# Patient Record
Sex: Female | Born: 1985 | Race: White | Hispanic: No | Marital: Married | Smoking: Former smoker
Health system: Southern US, Community
[De-identification: ages and names within clinical notes are randomized; demographics above are authoritative.]

## PROBLEM LIST (undated history)

## (undated) DIAGNOSIS — I10 Essential (primary) hypertension: Secondary | ICD-10-CM

---
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
## 2005-01-07 ENCOUNTER — Emergency Department: Payer: Self-pay | Admitting: Emergency Medicine

## 2007-11-23 ENCOUNTER — Emergency Department: Payer: Self-pay | Admitting: Emergency Medicine

## 2007-12-06 ENCOUNTER — Observation Stay: Payer: Self-pay | Admitting: Obstetrics and Gynecology

## 2007-12-07 ENCOUNTER — Observation Stay: Payer: Self-pay

## 2007-12-08 ENCOUNTER — Inpatient Hospital Stay: Payer: Self-pay | Admitting: Obstetrics and Gynecology

## 2009-07-24 ENCOUNTER — Emergency Department: Payer: Self-pay | Admitting: Emergency Medicine

## 2010-06-22 ENCOUNTER — Emergency Department: Payer: Self-pay | Admitting: Unknown Physician Specialty

## 2013-07-11 ENCOUNTER — Encounter: Payer: Self-pay | Admitting: *Deleted

## 2013-07-11 ENCOUNTER — Ambulatory Visit: Payer: Self-pay | Admitting: Cardiovascular Disease

## 2014-02-10 ENCOUNTER — Emergency Department: Payer: Self-pay | Admitting: Emergency Medicine

## 2014-10-21 ENCOUNTER — Emergency Department: Payer: Self-pay | Admitting: Internal Medicine

## 2014-10-29 ENCOUNTER — Observation Stay: Payer: Self-pay | Admitting: Surgery

## 2015-01-11 NOTE — Op Note (Signed)
PATIENT NAME:  Kristen Jacobson, Kristen Jacobson MR#:  [id number] DATE OF BIRTH:  08/16/1986  DATE OF PROCEDURE:  10/29/2014  PREOPERATIVE DIAGNOSIS: Right breast abscess.   POSTOPERATIVE DIAGNOSIS: Right breast abscess.  OPERATION: Incision and drainage.   ANESTHESIA: General.   SURGEON: Carmie Endalph L. Ely III, Jacobson.D.   OPERATIVE PROCEDURE: With the patient in the supine position after the induction of appropriate general anesthesia, patient's right breast was prepped with ChloraPrep and draped with sterile towels. Incision was made in the obvious fluctuant spot in the medial portion of the right breast. Creamy yellow pus was produced immediately. It was cultured. There appeared to be a fairly good-sized cavity which was opened.  A counter incision was made in the upper outer quadrant. A Penrose drain was placed between the two incisions. The drain was secured in place with 3-0 nylon.  Fluffs and a compressive dressing were applied. The patient was returned to the recovery room having tolerated the procedure well. Sponge, instrument, and needle count were correct x 2 in the operating room.   ____________________________ Quentin Orealph L. Ely III, MD rle:am D: 10/29/2014 17:51:23 ET T: 10/30/2014 02:46:36 ET JOB#: [id number]  cc: Quentin Orealph L. Ely III, MD, <Dictator> Quentin OreALPH L ELY MD ELECTRONICALLY SIGNED 10/31/2014 18:02

## 2016-05-30 ENCOUNTER — Encounter: Payer: Self-pay | Admitting: Emergency Medicine

## 2016-05-30 ENCOUNTER — Emergency Department: Payer: Self-pay

## 2016-05-30 ENCOUNTER — Emergency Department
Admission: EM | Admit: 2016-05-30 | Discharge: 2016-05-30 | Disposition: A | Discharge status: Home / Self Care | Payer: Self-pay | Attending: Emergency Medicine | Admitting: Emergency Medicine

## 2016-05-30 DIAGNOSIS — W19XXXA Unspecified fall, initial encounter: Secondary | ICD-10-CM | POA: Insufficient documentation

## 2016-05-30 DIAGNOSIS — S300XXA Contusion of lower back and pelvis, initial encounter: Secondary | ICD-10-CM | POA: Insufficient documentation

## 2016-05-30 DIAGNOSIS — Y9389 Activity, other specified: Secondary | ICD-10-CM | POA: Insufficient documentation

## 2016-05-30 DIAGNOSIS — Y929 Unspecified place or not applicable: Secondary | ICD-10-CM | POA: Insufficient documentation

## 2016-05-30 DIAGNOSIS — F17200 Nicotine dependence, unspecified, uncomplicated: Secondary | ICD-10-CM | POA: Insufficient documentation

## 2016-05-30 DIAGNOSIS — Y999 Unspecified external cause status: Secondary | ICD-10-CM | POA: Insufficient documentation

## 2016-05-30 LAB — POCT PREGNANCY, URINE: PREG TEST UR: NEGATIVE

## 2016-05-30 MED ORDER — NAPROXEN 500 MG PO TABS: 500.0000 mg | ORAL_TABLET | Freq: Two times a day (BID) | ORAL | 0 refills | Status: DC

## 2016-05-30 MED ORDER — OXYCODONE-ACETAMINOPHEN 5-325 MG PO TABS
2.0000 | ORAL_TABLET | Freq: Once | ORAL | Status: AC
  Administered 2016-05-30: 2 via ORAL
  Filled 2016-05-30: qty 2

## 2016-05-30 MED ORDER — OXYCODONE-ACETAMINOPHEN 5-325 MG PO TABS: 1.0000 | ORAL_TABLET | ORAL | 0 refills | Status: DC | PRN

## 2016-05-30 NOTE — ED Provider Notes (Signed)
[facility] Emergency Department Provider Note  ____________________________________________  Time seen: Approximately 1:42 PM  I have reviewed the triage vital signs and the nursing notes.   HISTORY  Chief Complaint Fall    HPI Kristen Jacobson is a 30 y.o. female who presents for evaluation of tailbone pain. Patient reports that she fell landing on her buttocks in the shower this morning. Describes pain as 10 over 10 nonradiating. Patient states she is unable to sit or stand comfortably.   History reviewed. No pertinent past medical history.  There are no active problems to display for this patient.   History reviewed. No pertinent surgical history.  Prior to Admission medications   Medication Sig Start Date End Date Taking? Authorizing Provider  naproxen (NAPROSYN) 500 MG tablet Take 1 tablet (500 mg total) by mouth 2 (two) times daily with a meal. 05/30/16   Evangeline Dakin, PA-C  oxyCODONE-acetaminophen (ROXICET) 5-325 MG tablet Take 1-2 tablets by mouth every 4 (four) hours as needed for severe pain. 05/30/16   Evangeline Dakin, PA-C    Allergies Review of patient's allergies indicates no known allergies.  No family history on file.  Social History Social History  Substance Use Topics  . Smoking status: Current Every Day Smoker  . Smokeless tobacco: Never Used  . Alcohol use No    Review of Systems Constitutional: No fever/chills Cardiovascular: Denies chest pain. Respiratory: Denies shortness of breath. Musculoskeletal: Positive for tailbone pain. Skin: Negative for rash. Neurological: Negative for headaches, focal weakness or numbness.  10-point ROS otherwise negative.  ____________________________________________   PHYSICAL EXAM:  VITAL SIGNS: ED Triage Vitals  Enc Vitals Group     BP 05/30/16 1220 (!) 158/101     Pulse Rate 05/30/16 1220 95     Resp 05/30/16 1220 18     Temp 05/30/16 1220 98.1 F (36.7 C)     Temp  Source 05/30/16 1220 Oral     SpO2 05/30/16 1220 97 %     Weight 05/30/16 1220 198 lb (89.8 kg)     Height 05/30/16 1220 5\' 3"  (1.6 m)     Head Circumference --      Peak Flow --      Pain Score 05/30/16 1221 8     Pain Loc --      Pain Edu? --      Excl. in GC? --     Constitutional: Alert and oriented. Well appearing and in no acute distress. Cardiovascular: Normal rate, regular rhythm. Grossly normal heart sounds.  Good peripheral circulation. Respiratory: Normal respiratory effort.  No retractions. Lungs CTAB. Musculoskeletal: Point tenderness tailbone no ecchymosis or bruising noted. Neurologic:  Normal speech and language. No gross focal neurologic deficits are appreciated. No gait instability. Skin:  Skin is warm, dry and intact. No rash noted. Psychiatric: Mood and affect are normal. Speech and behavior are normal.  ____________________________________________   LABS (all labs ordered are listed, but only abnormal results are displayed)  Labs Reviewed  POC URINE PREG, ED  POCT PREGNANCY, URINE   ____________________________________________  EKG   ____________________________________________  RADIOLOGY   ____________________________________________   PROCEDURES  Procedure(s) performed: None  Critical Care performed: No  ____________________________________________   INITIAL IMPRESSION / ASSESSMENT AND PLAN / ED COURSE  Pertinent labs & imaging results that were available during my care of the patient were reviewed by me and considered in my medical decision making (see chart for details). Review of the [place] CSRS was performed  in accordance of the [facility] prior to dispensing any controlled drugs.  Status post fall with acute coccyx contusion. Rx given for Percocet 5/325 and Naprosyn. Encouraged the use of soft cushion donut as needed for pain.  Clinical Course    ____________________________________________   FINAL CLINICAL IMPRESSION(S) / ED  DIAGNOSES  Final diagnoses:  Coccyx contusion, initial encounter     This chart was dictated using voice recognition software/Dragon. Despite best efforts to proofread, errors can occur which can change the meaning. Any change was purely unintentional.    Prestina Raigoza M Roben Schliep, PA-Evangeline Dakin 05/30/16 1550    Minna AntisKevin Paduchowski, MD 05/31/16 [phone]

## 2016-05-30 NOTE — ED Triage Notes (Signed)
States she fell in shower  Having increased pain to tailbone

## 2016-08-19 ENCOUNTER — Other Ambulatory Visit: Payer: Self-pay | Admitting: Orthopedic Surgery

## 2016-08-19 DIAGNOSIS — M5416 Radiculopathy, lumbar region: Secondary | ICD-10-CM

## 2016-08-30 ENCOUNTER — Ambulatory Visit
Admission: RE | Admit: 2016-08-30 | Discharge: 2016-08-30 | Disposition: A | Discharge status: Home / Self Care | Payer: BLUE CROSS/BLUE SHIELD | Source: Ambulatory Visit | Attending: Orthopedic Surgery | Admitting: Orthopedic Surgery

## 2016-08-30 DIAGNOSIS — M5416 Radiculopathy, lumbar region: Secondary | ICD-10-CM | POA: Diagnosis present

## 2016-08-30 DIAGNOSIS — M5127 Other intervertebral disc displacement, lumbosacral region: Secondary | ICD-10-CM | POA: Insufficient documentation

## 2016-08-30 DIAGNOSIS — M5136 Other intervertebral disc degeneration, lumbar region: Secondary | ICD-10-CM | POA: Diagnosis not present

## 2016-08-30 DIAGNOSIS — M5126 Other intervertebral disc displacement, lumbar region: Secondary | ICD-10-CM | POA: Diagnosis not present

## 2017-09-22 IMAGING — MR MR LUMBAR SPINE W/O CM
4 of 5 series · 15 of 48 positions shown · non-contrast
Comparison: 02/10/2014 plain film exam of the lumbar spine.
06/22/2010 chest x-ray.

CLINICAL DATA: 30-year-old female injured from fall in [REDACTED]. Low
back pain and right leg pain extending to foot. Initial encounter.

EXAM:
MRI LUMBAR SPINE WITHOUT CONTRAST
TECHNIQUE: Multiplanar, multisequence MR imaging of the lumbar spine was
performed. No intravenous contrast was administered.

[Series 2: T2 · sagittal · 4.0mm · 0.44mm/px · 6 of 15 slices shown (1 of 2)]
[im 1/15]
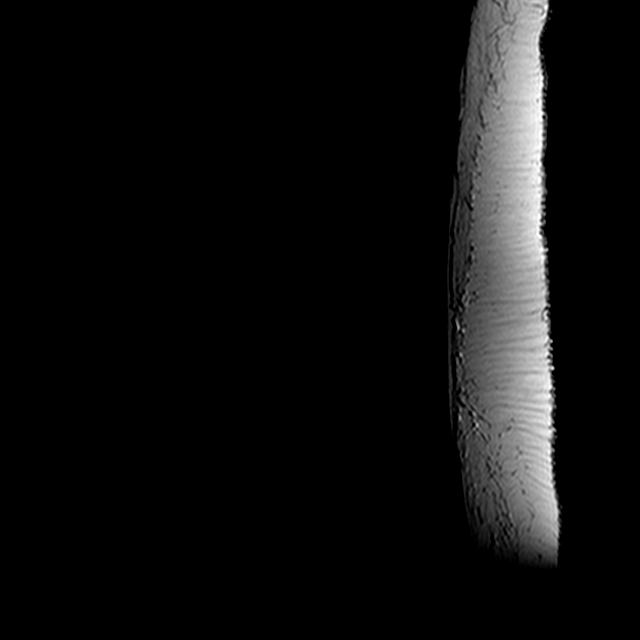
[im 3/15]
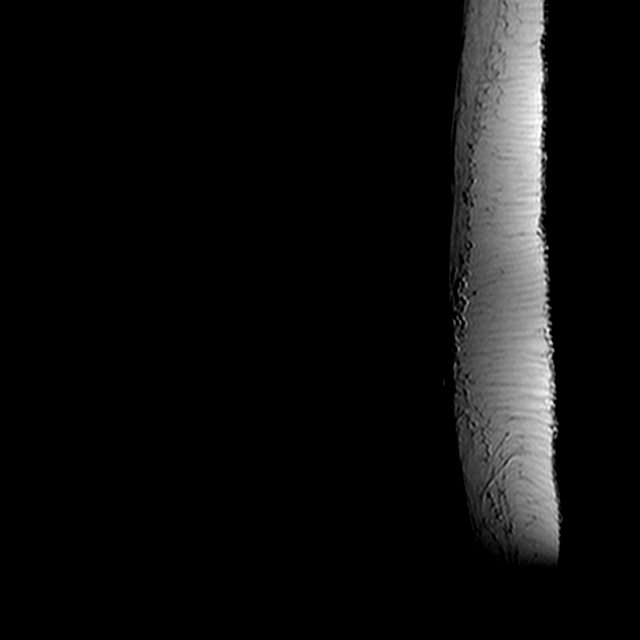
[im 6/15]
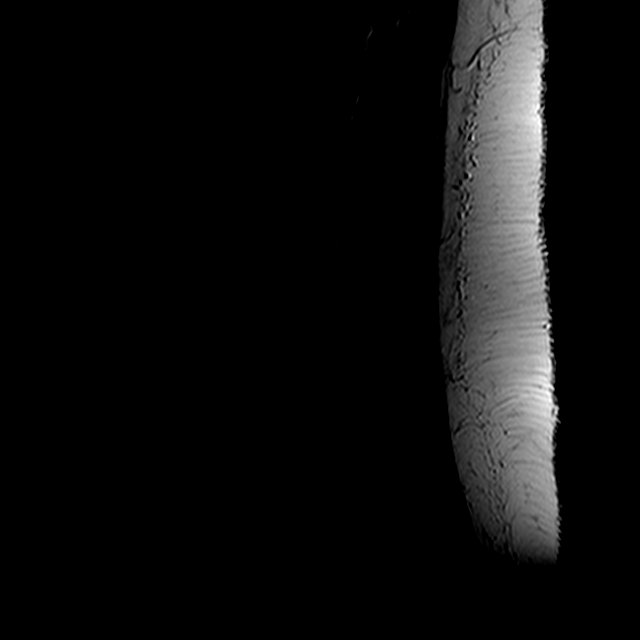
[im 9/15]
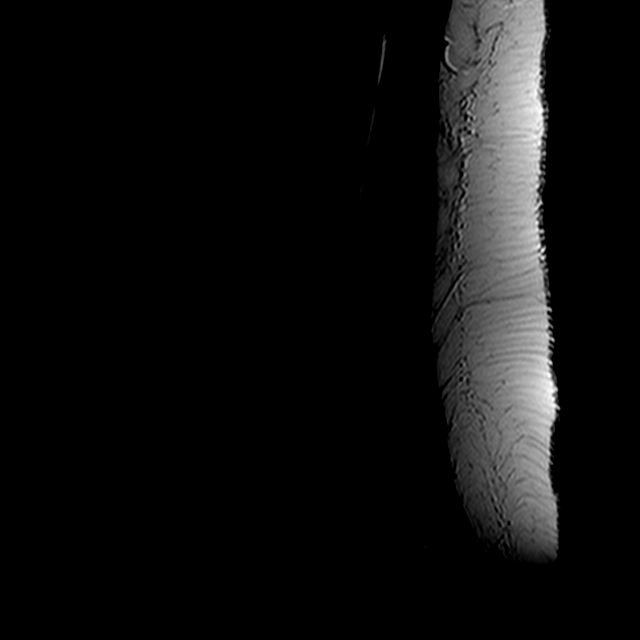
[im 12/15]
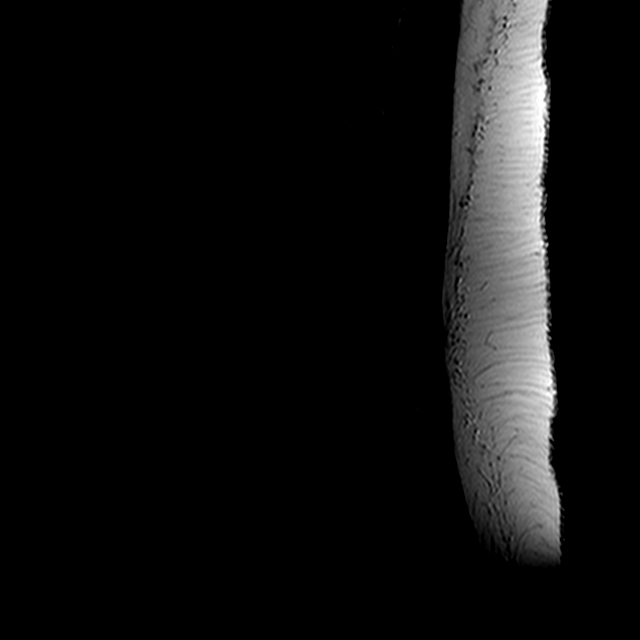
[im 15/15]
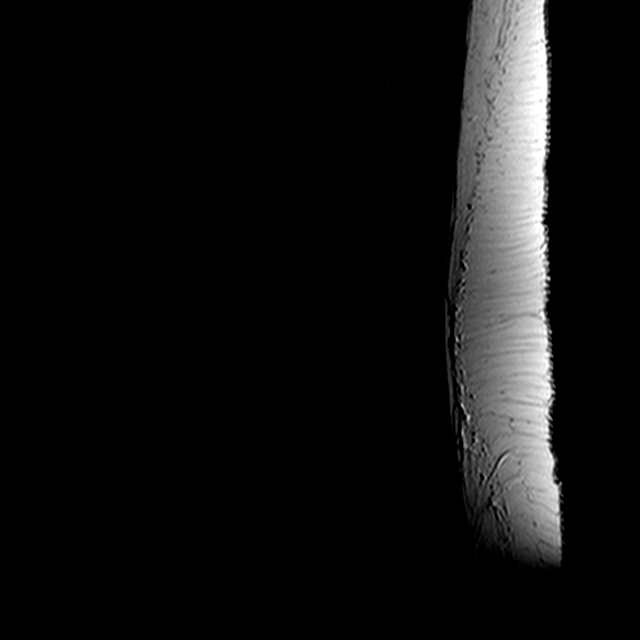

[Series 3: T1 · sagittal · 4.0mm · 0.44mm/px · 3 of 15 slices shown (1 of 2)]
[im 3/15]
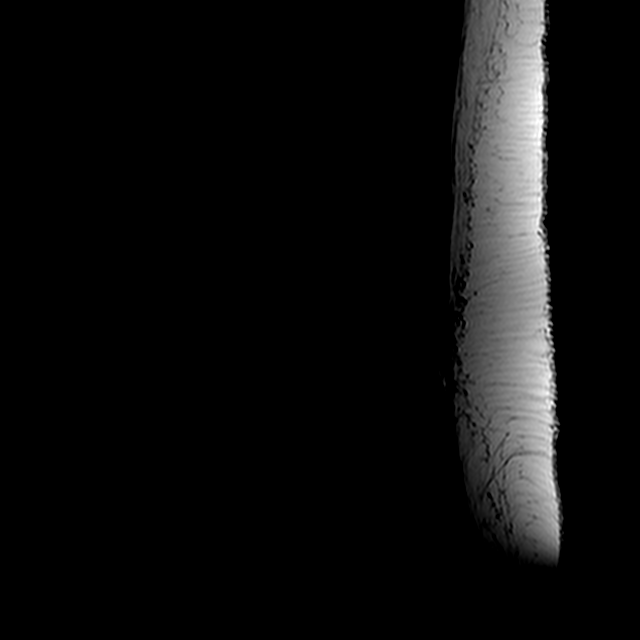
[im 9/15]
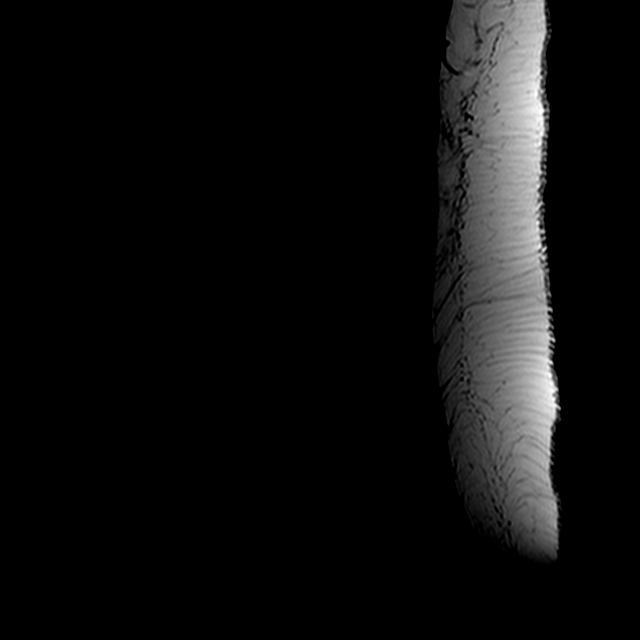
[im 15/15]
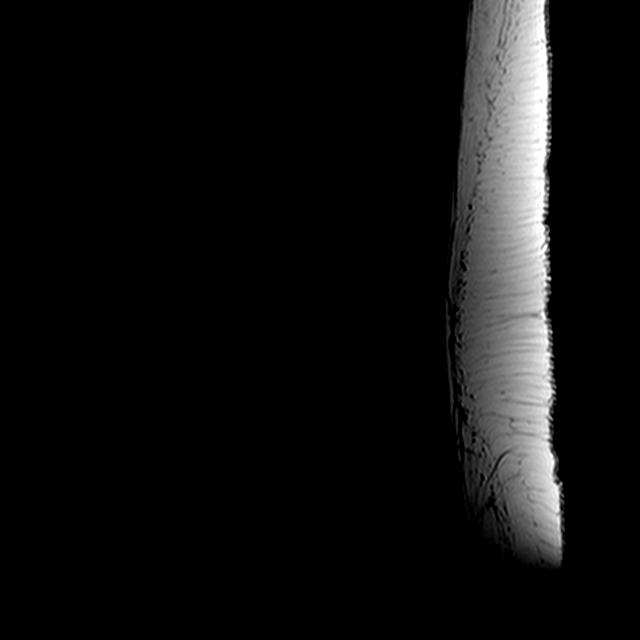

[Series 5: T2 · axial · 4.0mm · 0.39mm/px · z∈[-146,+12]mm · 3 of 38 slices shown (2 of 2)]
[im 6/38]
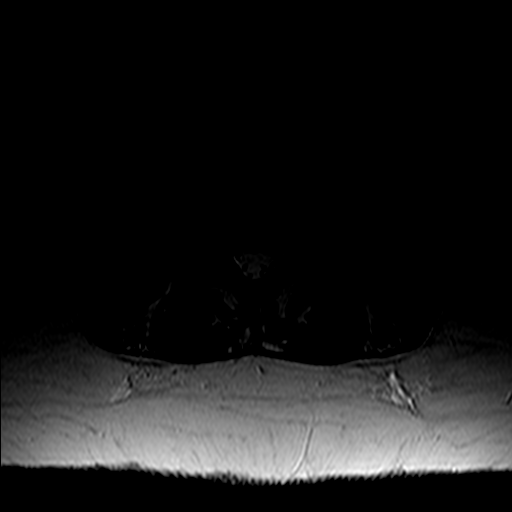
[im 19/38]
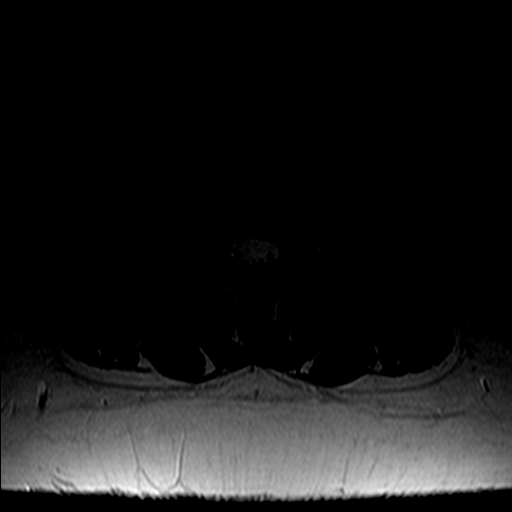
[im 32/38]
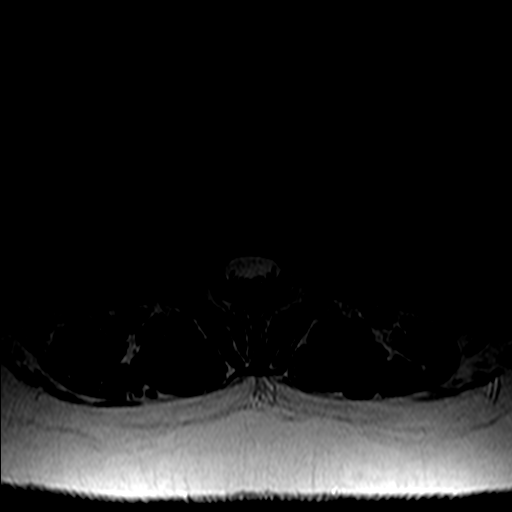

[Series 6: T1 · axial · 4.0mm · 0.39mm/px · z∈[-146,+12]mm · 3 of 38 slices shown (2 of 2)]
[im 6/38]
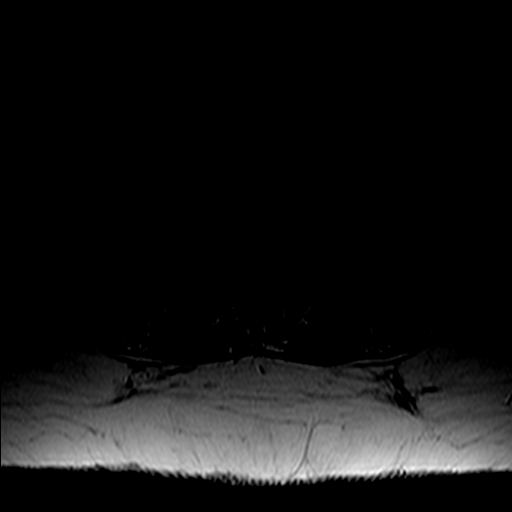
[im 19/38]
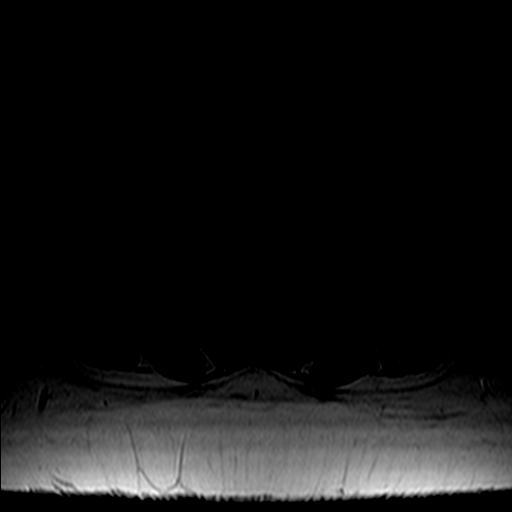
[im 32/38]
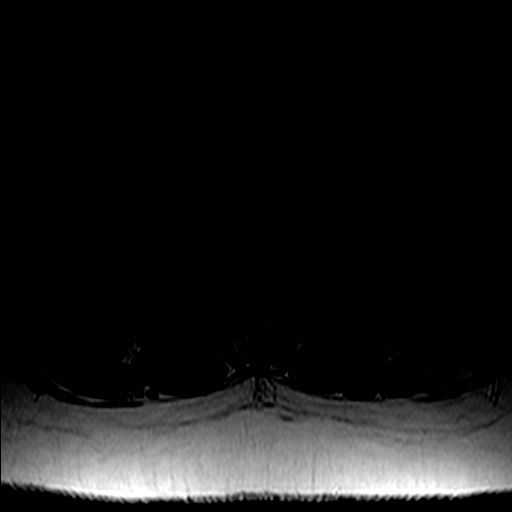

[15 of 48 positions shown; findings below may reference images not displayed]

FINDINGS: Segmentation: Transition appearance of the L5 vertebral body with
small L5-S1 disc space. Utilizing this level assignment, the present
examination incorporates from T11-12 disc space level through lower
sacrum.

Alignment:  Normal.

Vertebrae:  Endplate reactive changes L4-5.

Conus medullaris: Extends to the T12-L1 level and appears normal.

Paraspinal and other soft tissues: Multiple ovarian follicles
incompletely assessed on present exam.

Disc levels:

T11-12 and T12-L1:  Negative.

L1-2: Minimal remote Schmorl's node deformity.

L2-3:  Negative.

L3-4:  Minimal facet degenerative changes.

L4-5: Disc degeneration with disc space narrowing and endplate
reactive changes. Moderate bulge/ small central protrusion with mild
indentation ventral thecal sac without significant spinal stenosis
or nerve root compression. Facet degenerative changes.

L5-S1: Small disc space. Minimal bulge. Minimal facet degenerative
changes. No spinal stenosis or foraminal narrowing.
IMPRESSION: Transition appearance of the L5 vertebral body with small L5-S1 disc
space. Level assignment confirmed on plain film examination where
there are hypoplastic T12 ribs. Iliac crests projecting at the lower
L4 level.

L4-5 moderate bulge/ small central protrusion with mild indentation
ventral thecal sac without significant spinal stenosis or nerve root
compression. Facet degenerative changes.

L5-S1 minimal bulge.

## 2018-06-18 ENCOUNTER — Encounter: Payer: Self-pay | Admitting: Emergency Medicine

## 2018-06-18 ENCOUNTER — Other Ambulatory Visit: Payer: Self-pay

## 2018-06-18 ENCOUNTER — Emergency Department
Admission: EM | Admit: 2018-06-18 | Discharge: 2018-06-18 | Disposition: A | Discharge status: Home / Self Care | Payer: BLUE CROSS/BLUE SHIELD | Attending: Student in an Organized Health Care Education/Training Program | Admitting: Student in an Organized Health Care Education/Training Program

## 2018-06-18 ENCOUNTER — Emergency Department: Payer: BLUE CROSS/BLUE SHIELD

## 2018-06-18 DIAGNOSIS — S93401A Sprain of unspecified ligament of right ankle, initial encounter: Secondary | ICD-10-CM | POA: Diagnosis not present

## 2018-06-18 DIAGNOSIS — X58XXXA Exposure to other specified factors, initial encounter: Secondary | ICD-10-CM | POA: Insufficient documentation

## 2018-06-18 DIAGNOSIS — S99911A Unspecified injury of right ankle, initial encounter: Secondary | ICD-10-CM | POA: Diagnosis present

## 2018-06-18 DIAGNOSIS — R200 Anesthesia of skin: Secondary | ICD-10-CM | POA: Insufficient documentation

## 2018-06-18 DIAGNOSIS — F17200 Nicotine dependence, unspecified, uncomplicated: Secondary | ICD-10-CM | POA: Diagnosis not present

## 2018-06-18 DIAGNOSIS — Y939 Activity, unspecified: Secondary | ICD-10-CM | POA: Insufficient documentation

## 2018-06-18 DIAGNOSIS — Y999 Unspecified external cause status: Secondary | ICD-10-CM | POA: Insufficient documentation

## 2018-06-18 DIAGNOSIS — R600 Localized edema: Secondary | ICD-10-CM | POA: Insufficient documentation

## 2018-06-18 DIAGNOSIS — Y929 Unspecified place or not applicable: Secondary | ICD-10-CM | POA: Insufficient documentation

## 2018-06-18 MED ORDER — NAPROXEN 500 MG PO TABS: 500.0000 mg | ORAL_TABLET | Freq: Two times a day (BID) | ORAL | 0 refills | Status: DC

## 2018-06-18 NOTE — ED Provider Notes (Signed)
[facility] Emergency Department Provider Note  ____________________________________________   First MD Initiated Contact with Patient 06/18/18 1205     (approximate)  I have reviewed the triage vital signs and the nursing notes.   HISTORY  Chief Complaint Numbness   HPI Kristen Jacobson is a 32 y.o. female presents to the ED with complaint of right foot numbness that started when she injured her foot approximately 1 month ago.  She states she twisted her ankle as had some numbness in her toes.  She states that she was not seen at the time of her injury.  She continues to have some swelling around her ankle.  She has not been taking over-the-counter medication for pain or inflammation.  She states that she can move her first and fifth toe.  She denies any previous injury to her ankle.  Currently she rates her pain as a 0/10.  History reviewed. No pertinent past medical history.  There are no active problems to display for this patient.   History reviewed. No pertinent surgical history.  Prior to Admission medications   Medication Sig Start Date End Date Taking? Authorizing Provider  naproxen (NAPROSYN) 500 MG tablet Take 1 tablet (500 mg total) by mouth 2 (two) times daily with a meal. 06/18/18   Tommi Rumps, PA-C    Allergies Coconut flavor  No family history on file.  Social History Social History   Tobacco Use  . Smoking status: Current Every Day Smoker  . Smokeless tobacco: Never Used  Substance Use Topics  . Alcohol use: No  . Drug use: Not on file    Review of Systems Constitutional: No fever/chills Cardiovascular: Denies chest pain. Respiratory: Denies shortness of breath. Musculoskeletal: Positive for right ankle pain. Skin: Negative for rash. Neurological: Negative for focal weakness.  Positive for numbness right ankle, second, third and fourth digits. ___________________________________________   PHYSICAL  EXAM:  VITAL SIGNS: ED Triage Vitals  Enc Vitals Group     BP 06/18/18 1135 (!) 143/75     Pulse Rate 06/18/18 1135 100     Resp 06/18/18 1135 (!) 2     Temp 06/18/18 1135 98.2 F (36.8 C)     Temp Source 06/18/18 1135 Oral     SpO2 06/18/18 1135 100 %     Weight 06/18/18 1136 150 lb (68 kg)     Height 06/18/18 1136 5\' 6"  (1.676 m)     Head Circumference --      Peak Flow --      Pain Score 06/18/18 1147 0     Pain Loc --      Pain Edu? --      Excl. in GC? --    Constitutional: Alert and oriented. Well appearing and in no acute distress. Eyes: Conjunctivae are normal.  Head: Atraumatic. Neck: No stridor.   Cardiovascular: Normal rate, regular rhythm. Grossly normal heart sounds.  Good peripheral circulation. Respiratory: Normal respiratory effort.  No retractions. Lungs CTAB. Musculoskeletal: Examination of the right ankle there is no gross deformity however there is still soft tissue swelling on the lateral aspect.  Range of motion is restricted secondary to discomfort.  Muscle strength is decreased in the right compared to the left.   Patient is able flex and extend foot with restriction.  Patient has minimal flexion extension in digits 1 through 5.  Skin is intact and no ecchymosis or abrasions are seen.  Pulses present. Neurologic:  Normal speech and language. No  gross focal neurologic deficits are appreciated.  Skin:  Skin is warm, dry and intact.  Psychiatric: Mood and affect are normal. Speech and behavior are normal.  ____________________________________________   LABS (all labs ordered are listed, but only abnormal results are displayed)  Labs Reviewed - No data to display   RADIOLOGY  ED MD interpretation:   Right ankle x-ray is negative for acute bony injury.  Official radiology report(s): Dg Ankle Complete Right  Result Date: 06/18/2018 CLINICAL DATA:  Right ankle pain and numbness following an injury 1 month ago. EXAM: RIGHT ANKLE - COMPLETE 3+ VIEW  COMPARISON:  None. FINDINGS: Mild lateral soft tissue swelling. Possible small effusion. No fracture or dislocation seen. IMPRESSION: Mild lateral soft tissue swelling and possible small effusion with no fracture seen. Electronically Signed   By: Beckie Salts M.D.   On: 06/18/2018 13:39    ____________________________________________   PROCEDURES  Procedure(s) performed:   .Splint Application Date/Time: 06/18/2018 6:36 PM Performed by: Rosalio Loud Authorized by: Tommi Rumps, PA-C   Consent:    Consent obtained:  Verbal   Consent given by:  Patient   Risks discussed:  Pain and swelling   Alternatives discussed:  Referral Pre-procedure details:    Sensation:  Numbness Procedure details:    Laterality:  Right   Location:  Ankle   Ankle:  R ankle   Strapping: yes     Splint type:  Ankle stirrup   Supplies:  Prefabricated splint Post-procedure details:    Pain:  Improved   Sensation:  Numbness   Patient tolerance of procedure:  Tolerated well, no immediate complications  Crutches  Critical Care performed: No  ____________________________________________   INITIAL IMPRESSION / ASSESSMENT AND PLAN / ED COURSE  As part of my medical decision making, I reviewed the following data within the electronic MEDICAL RECORD NUMBER Notes from prior ED visits and [place] Controlled Substance Database  Patient presents with complaint of right ankle pain and numbness along with numbness to her second, third and fourth digits for 1 month.  Patient had an injury but was never evaluated.  X-rays were negative for acute bony injury.  Patient does have decreased muscle strength in comparison to the left.  Patient was placed in a stirrup ankle splint and given crutches.  She is also placed on naproxen 500 mg twice daily with food.  She is strongly urged to follow-up with orthopedics.  This is concerning for possible dropfoot or nerve compression secondary to her  injury.  ____________________________________________   FINAL CLINICAL IMPRESSION(S) / ED DIAGNOSES  Final diagnoses:  Sprain of right ankle, unspecified ligament, initial encounter     ED Discharge Orders         Ordered    naproxen (NAPROSYN) 500 MG tablet  2 times daily with meals     06/18/18 1447           Note:  This document was prepared using Dragon voice recognition software and may include unintentional dictation errors.    Tommi Rumps, PA-C 06/18/18 1839    Willy Eddy, MD 06/19/18 [phone]

## 2018-06-18 NOTE — ED Notes (Signed)
See triage note  Presents with numbness to right foot  States she had injury to foot about 1 month ago  And then again 2 weeks after  No swelling noted at present but states she has numbness to her toes to same foot  Positive pulses no swelling

## 2018-06-18 NOTE — ED Triage Notes (Signed)
Says numbness in right ankle since she injured it at the beginning of September.  Did not get seen at that time.  Says she can move 1st and 5th toes and cant move the rest.

## 2018-06-18 NOTE — Discharge Instructions (Addendum)
Follow-up with Dr. Joice Lofts who is the orthopedist at [facility] that is on-call today.  Wear ankle brace for support.  Ice and elevate as needed for swelling.  Use crutches when walking for added support.  Is important to follow-up with orthopedics as more testing needs to be done. Take naproxen 500 mg twice daily with food for pain and inflammation.

## 2018-09-12 NOTE — L&D Delivery Note (Signed)
Delivery Note Primary OB: No prenatal care Delivery Provider: Marcelyn Bruins, CNM Gestational Age: Full term Antepartum complications: Drug use, elevated blood pressure in labor Intrapartum complications: None  A viable Female was delivered via vertex presentation, ROA. A loose nuchal cord was reduced. Delivery of the shoulders and body followed without difficulty. The infant was placed on the maternal abdomen. The umbilical cord was doubly clamped and cut; delayed cord clamping was not done at the direction of the neonatal nurse practitioner. Cord blood was collected. The placenta was delivered spontaneously and was inspected and found to be intact with a three vessel cord, with a marginal cord insertion. The cervix and vagina were inspected. There was a first degree perineal laceration that was hemostatic. Benefits of perineal repair were discussed, patient declined sutures.  The fundus was firm. After transition, the newborn was returned to mom; patient and infant were bonding in stable condition. All counts were correct.  Apgars: 6, 9  Weight:  6 lb, 5.9 oz.    Anesthesia:  none Episiotomy:  none Lacerations:  1st degree Suture Repair: none Quantitative Blood Loss (mL):  210  Mom to postpartum.  Baby to Couplet care / Skin to Skin.  Marcelyn Bruins, CNM [facility], [facility] 01/07/2019

## 2018-09-18 ENCOUNTER — Emergency Department: Payer: BLUE CROSS/BLUE SHIELD

## 2018-09-18 ENCOUNTER — Encounter: Payer: Self-pay | Admitting: Emergency Medicine

## 2018-09-18 ENCOUNTER — Emergency Department
Admission: EM | Admit: 2018-09-18 | Discharge: 2018-09-18 | Disposition: A | Discharge status: Home / Self Care | Payer: BLUE CROSS/BLUE SHIELD | Attending: Emergency Medicine | Admitting: Emergency Medicine

## 2018-09-18 DIAGNOSIS — R51 Headache: Secondary | ICD-10-CM | POA: Diagnosis not present

## 2018-09-18 DIAGNOSIS — F1510 Other stimulant abuse, uncomplicated: Secondary | ICD-10-CM | POA: Insufficient documentation

## 2018-09-18 DIAGNOSIS — O99332 Smoking (tobacco) complicating pregnancy, second trimester: Secondary | ICD-10-CM | POA: Diagnosis not present

## 2018-09-18 DIAGNOSIS — O0932 Supervision of pregnancy with insufficient antenatal care, second trimester: Secondary | ICD-10-CM | POA: Insufficient documentation

## 2018-09-18 DIAGNOSIS — Z3A21 21 weeks gestation of pregnancy: Secondary | ICD-10-CM

## 2018-09-18 DIAGNOSIS — F17200 Nicotine dependence, unspecified, uncomplicated: Secondary | ICD-10-CM | POA: Insufficient documentation

## 2018-09-18 DIAGNOSIS — O9989 Other specified diseases and conditions complicating pregnancy, childbirth and the puerperium: Secondary | ICD-10-CM | POA: Diagnosis not present

## 2018-09-18 LAB — CBC
HCT: 32.6 % — ABNORMAL LOW (ref 36.0–46.0)
Hemoglobin: 10.8 g/dL — ABNORMAL LOW (ref 12.0–15.0)
MCH: 29.8 pg (ref 26.0–34.0)
MCHC: 33.1 g/dL (ref 30.0–36.0)
MCV: 90.1 fL (ref 80.0–100.0)
PLATELETS: 261 10*3/uL (ref 150–400)
RBC: 3.62 MIL/uL — AB (ref 3.87–5.11)
RDW: 12.7 % (ref 11.5–15.5)
WBC: 10.3 10*3/uL (ref 4.0–10.5)
nRBC: 0 % (ref 0.0–0.2)

## 2018-09-18 LAB — COMPREHENSIVE METABOLIC PANEL
ALT: 21 U/L (ref 0–44)
ANION GAP: 7 (ref 5–15)
AST: 23 U/L (ref 15–41)
Albumin: 3 g/dL — ABNORMAL LOW (ref 3.5–5.0)
Alkaline Phosphatase: 98 U/L (ref 38–126)
BILIRUBIN TOTAL: 0.4 mg/dL (ref 0.3–1.2)
BUN: 6 mg/dL (ref 6–20)
CO2: 24 mmol/L (ref 22–32)
Calcium: 9.1 mg/dL (ref 8.9–10.3)
Chloride: 104 mmol/L (ref 98–111)
Creatinine, Ser: 0.52 mg/dL (ref 0.44–1.00)
GFR calc Af Amer: >60 mL/min (ref >60)
GLUCOSE: 112 mg/dL — AB (ref 70–99)
POTASSIUM: 3.8 mmol/L (ref 3.5–5.1)
Sodium: 135 mmol/L (ref 135–145)
TOTAL PROTEIN: 6.8 g/dL (ref 6.5–8.1)

## 2018-09-18 LAB — POCT PREGNANCY, URINE: PREG TEST UR: POSITIVE — AB

## 2018-09-18 LAB — URINE DRUG SCREEN, QUALITATIVE (ARMC ONLY)
Amphetamines, Ur Screen: POSITIVE — AB
BENZODIAZEPINE, UR SCRN: NOT DETECTED
Barbiturates, Ur Screen: NOT DETECTED
CANNABINOID 50 NG, UR ~~LOC~~: NOT DETECTED
Cocaine Metabolite,Ur ~~LOC~~: NOT DETECTED
MDMA (ECSTASY) UR SCREEN: NOT DETECTED
Methadone Scn, Ur: NOT DETECTED
Opiate, Ur Screen: NOT DETECTED
PHENCYCLIDINE (PCP) UR S: NOT DETECTED
TRICYCLIC, UR SCREEN: NOT DETECTED

## 2018-09-18 LAB — ETHANOL

## 2018-09-18 LAB — HCG, QUANTITATIVE, PREGNANCY: HCG, BETA CHAIN, QUANT, S: 12944 m[IU]/mL — AB (ref <5)

## 2018-09-18 LAB — TSH: TSH: 1.41 u[IU]/mL (ref 0.350–4.500)

## 2018-09-18 MED ORDER — ACETAMINOPHEN 325 MG PO TABS
650.0000 mg | ORAL_TABLET | Freq: Once | ORAL | Status: AC
  Administered 2018-09-18: 650 mg via ORAL
  Filled 2018-09-18: qty 2

## 2018-09-18 NOTE — ED Triage Notes (Signed)
Pt states that she has not had any prenatal care and denies SI.

## 2018-09-18 NOTE — ED Notes (Signed)
Pt called to front desk without answer.

## 2018-09-18 NOTE — ED Triage Notes (Signed)
Pt brought over by [facility] officers at the request of the judge. 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 Pt estimates she is approximately 5-6 months and states she last used on 2 days ago.

## 2018-09-18 NOTE — ED Notes (Signed)
In room. Waiting on [place] results.  NAD.

## 2018-09-18 NOTE — ED Notes (Signed)
FHT [id number]

## 2018-09-18 NOTE — Discharge Instructions (Addendum)
Please follow-up with [facility].  Their contact information is on the other sheet of paper that we gave you.  Please also call [facility] and see if there is anything they can do to help you their phone number is [phone].  These return here for any further problems.  Please follow-up with Dr. Leeroy Bock Ward she is the OB/GYN on duty tonight.  She is very good and will up with you for your pregnancy.  Her phone number is attached

## 2018-09-18 NOTE — ED Notes (Signed)
Officer Corporate investment banker with [employer] has called to give information on [place] for pregnant ladies. Pt called to front desk x 2 without answer.

## 2018-09-18 NOTE — ED Provider Notes (Addendum)
[facility] Emergency Department Provider Note   ____________________________________________   First MD Initiated Contact with Patient 09/18/18 1641     (approximate)  I have reviewed the triage vital signs and the nursing notes.   HISTORY  Chief Complaint Drug Problem and Possible Pregnancy    HPI ILINCA ROCHA is a 33 y.o. female who was sent here from court.  She has been using methamphetamines and is pregnant.  She thinks she might be 5 or 6 months pregnant.  She has had no prenatal care.  She has no complaints of any problems except a mild headache.  She has not used amphetamines in 2 days.   History reviewed. No pertinent past medical history.  There are no active problems to display for this patient.  Patient denies any past medical history  Prior to Admission medications   Medication Sig Start Date End Date Taking? Authorizing Provider  naproxen (NAPROSYN) 500 MG tablet Take 1 tablet (500 mg total) by mouth 2 (two) times daily with a meal. 06/18/18   Tommi Rumps, PA-C    Allergies Coconut flavor  No family history on file.  Social History Social History   Tobacco Use  . Smoking status: Current Every Day Smoker  . Smokeless tobacco: Never Used  Substance Use Topics  . Alcohol use: No  . Drug use: Not on file    Review of Systems  Constitutional: No fever/chills Eyes: No visual changes. ENT: No sore throat. Cardiovascular: Denies chest pain. Respiratory: Denies shortness of breath. Gastrointestinal: No abdominal pain.  No nausea, no vomiting.  No diarrhea.  No constipation. Genitourinary: Negative for dysuria. Musculoskeletal: Negative for back pain. Skin: Negative for rash. Neurological: Negative for headaches, focal weakness   ____________________________________________   PHYSICAL EXAM:  VITAL SIGNS: ED Triage Vitals  Enc Vitals Group     BP 09/18/18 1357 106/67     Pulse Rate 09/18/18 1357 96   Resp 09/18/18 1357 14     Temp 09/18/18 1357 98.6 F (37 C)     Temp Source 09/18/18 1357 Oral     SpO2 09/18/18 1357 99 %     Weight 09/18/18 1345 167 lb (75.8 kg)     Height 09/18/18 1345 5\' 5"  (1.651 m)     Head Circumference --      Peak Flow --      Pain Score 09/18/18 1345 0     Pain Loc --      Pain Edu? --      Excl. in GC? --    Constitutional: Alert and oriented. Well appearing and in no acute distress. Eyes: Conjunctivae are normal. Head: Atraumatic. Nose: No congestion/rhinnorhea. Mouth/Throat: Mucous membranes are moist.  Oropharynx non-erythematous. Neck: No stridor.   Cardiovascular: Normal rate, regular rhythm. Grossly normal heart sounds.  Good peripheral circulation. Respiratory: Normal respiratory effort.  No retractions. Lungs CTAB. Gastrointestinal: Soft and nontender. No distention. No abdominal bruits.  Patient does not seem to be es months pregnant although her abdomen is larger than normal no CVA tenderness. Musculoskeletal: No lower extremity tenderness nor edema.  No joint effusions. Neurologic:  Normal speech and language. No gross focal neurologic deficits are appreciated. No gait instability. Skin:  Skin is warm, dry and intact. No rash noted. Psychiatric: Mood and affect are normal. Speech and behavior are normal.  ____________________________________________   LABS (all labs ordered are listed, but only abnormal results are displayed)  Labs Reviewed  COMPREHENSIVE METABOLIC PANEL - Abnormal;  Notable for the following components:      Result Value   Glucose, Bld 112 (*)    Albumin 3.0 (*)    All other components within normal limits  CBC - Abnormal; Notable for the following components:   RBC 3.62 (*)    Hemoglobin 10.8 (*)    HCT 32.6 (*)    All other components within normal limits  URINE DRUG SCREEN, QUALITATIVE ([facility] ONLY) - Abnormal; Notable for the following components:   Amphetamines, Ur Screen POSITIVE (*)    All other components  within normal limits  HCG, QUANTITATIVE, PREGNANCY - Abnormal; Notable for the following components:   hCG, Beta Chain, Quant, S 12,944 (*)    All other components within normal limits  POCT PREGNANCY, URINE - Abnormal; Notable for the following components:   Preg Test, Ur POSITIVE (*)    All other components within normal limits  ETHANOL  TSH   ____________________________________________  EKG   ____________________________________________  RADIOLOGY  ED MD interpretation: Ultrasound reviewed by radiology shows a 21-week 6-day IUP  Official radiology report(s): US Ob Limited  Result Date: 09/18/2018 CLINICAL DATA:  Second trimester pregnancy. No prenatal care. Unsure of LMP. Methamphetamine abuse. EXAM: LIMITED OBSTETRIC ULTRASOUND FINDINGS: Number of Fetuses: 1 Heart Rate:  168 bpm Movement: Yes Presentation: Transverse with head to maternal left Placental Location: Posterior Previa: No Amniotic Fluid (Subjective):  Within normal limits. BPD: 5.2 cm 21 w  6 d MATERNAL FINDINGS: Cervix:  Appears closed.  Measures 4.6 cm in length. Uterus/Adnexae: No abnormality visualized. IMPRESSION: Single living intrauterine fetus. No acute maternal findings identified. This exam is performed on an emergent basis and does not comprehensively evaluate fetal size, dating, or anatomy; follow-up complete OB US should be considered if further fetal assessment is warranted. Electronically Signed   By: Myles Rosenthal M.D.   On: 09/18/2018 18:24    ____________________________________________   PROCEDURES  Procedure(s) performed:   Procedures  Critical Care performed:   ____________________________________________   INITIAL IMPRESSION / ASSESSMENT AND PLAN / ED COURSE  Patient sent here from court.  She is pregnant and using methamphetamine.  We had consulted TTS and called TTS but TTS said they were going to be changing shifts and would have the next person coming on to see the patient but  apparently there was no next person as there is nobody on for TTS tonight.  Patient seems to be interested in treating the baby appropriately I will let her go with contact information for [facility] and [facility].  She can follow-up with them return here for any further problems she has mandatory follow-up elsewhere as well.  Disposed contact P pretrial services every Wednesday and follow-up with [facility]. Risks of methamphetamine to the fetus were discussed in detail with the patient.        ____________________________________________   FINAL CLINICAL IMPRESSION(S) / ED DIAGNOSES  Final diagnoses:  [redacted] weeks gestation of pregnancy  Methamphetamine use [facility])     ED Discharge Orders    None       Note:  This document was prepared using Dragon voice recognition software and may include unintentional dictation errors.    Arnaldo Natal, MD 09/18/18 2214    Arnaldo Natal, MD 09/18/18 2215

## 2018-09-18 NOTE — ED Notes (Signed)
Pt states she is in at direction of judge to be evaluated for detox and due to pregnancy. Pt states she believes she is 5-6 months pregnant. LMP was in July 2019. Pt is A&Ox4, cooperative, well dressed and appropriately groomed. Hygiene appears appropriate. Pt resting comfortably.

## 2019-01-07 ENCOUNTER — Encounter: Payer: Self-pay | Admitting: Anesthesiology

## 2019-01-07 ENCOUNTER — Inpatient Hospital Stay
Admission: EM | Admit: 2019-01-07 | Discharge: 2019-01-09 | DRG: 806 | Disposition: A | Discharge status: Home / Self Care | Payer: BLUE CROSS/BLUE SHIELD | Attending: Obstetrics and Gynecology | Admitting: Obstetrics and Gynecology

## 2019-01-07 ENCOUNTER — Other Ambulatory Visit: Payer: Self-pay

## 2019-01-07 DIAGNOSIS — O1404 Mild to moderate pre-eclampsia, complicating childbirth: Secondary | ICD-10-CM | POA: Diagnosis present

## 2019-01-07 DIAGNOSIS — O99324 Drug use complicating childbirth: Secondary | ICD-10-CM | POA: Diagnosis present

## 2019-01-07 DIAGNOSIS — O4292 Full-term premature rupture of membranes, unspecified as to length of time between rupture and onset of labor: Principal | ICD-10-CM | POA: Diagnosis present

## 2019-01-07 DIAGNOSIS — O4290 Premature rupture of membranes, unspecified as to length of time between rupture and onset of labor, unspecified weeks of gestation: Secondary | ICD-10-CM | POA: Diagnosis present

## 2019-01-07 DIAGNOSIS — F1590 Other stimulant use, unspecified, uncomplicated: Secondary | ICD-10-CM | POA: Diagnosis present

## 2019-01-07 DIAGNOSIS — O9081 Anemia of the puerperium: Secondary | ICD-10-CM | POA: Diagnosis not present

## 2019-01-07 DIAGNOSIS — O4202 Full-term premature rupture of membranes, onset of labor within 24 hours of rupture: Secondary | ICD-10-CM

## 2019-01-07 DIAGNOSIS — D62 Acute posthemorrhagic anemia: Secondary | ICD-10-CM | POA: Diagnosis not present

## 2019-01-07 DIAGNOSIS — Z3A37 37 weeks gestation of pregnancy: Secondary | ICD-10-CM

## 2019-01-07 DIAGNOSIS — Z87891 Personal history of nicotine dependence: Secondary | ICD-10-CM

## 2019-01-07 HISTORY — DX: Essential (primary) hypertension: I10

## 2019-01-07 LAB — URINE DRUG SCREEN, QUALITATIVE (ARMC ONLY)
Amphetamines, Ur Screen: POSITIVE — AB
Barbiturates, Ur Screen: NOT DETECTED
Benzodiazepine, Ur Scrn: NOT DETECTED
Cannabinoid 50 Ng, Ur ~~LOC~~: NOT DETECTED
Cocaine Metabolite,Ur ~~LOC~~: NOT DETECTED
MDMA (Ecstasy)Ur Screen: NOT DETECTED
Methadone Scn, Ur: NOT DETECTED
Opiate, Ur Screen: NOT DETECTED
Phencyclidine (PCP) Ur S: NOT DETECTED
Tricyclic, Ur Screen: NOT DETECTED

## 2019-01-07 LAB — RAPID HIV SCREEN (HIV 1/2 AB+AG)
HIV 1/2 Antibodies: NONREACTIVE
HIV-1 P24 Antigen - HIV24: NONREACTIVE

## 2019-01-07 LAB — COMPREHENSIVE METABOLIC PANEL
ALT: 17 U/L (ref 0–44)
AST: 33 U/L (ref 15–41)
Albumin: 2.8 g/dL — ABNORMAL LOW (ref 3.5–5.0)
Alkaline Phosphatase: 284 U/L — ABNORMAL HIGH (ref 38–126)
Anion gap: 15 (ref 5–15)
BUN: 12 mg/dL (ref 6–20)
CO2: 19 mmol/L — ABNORMAL LOW (ref 22–32)
Calcium: 9.7 mg/dL (ref 8.9–10.3)
Chloride: 99 mmol/L (ref 98–111)
Creatinine, Ser: 0.68 mg/dL (ref 0.44–1.00)
GFR calc Af Amer: >60 mL/min (ref >60)
GFR calc non Af Amer: >60 mL/min (ref >60)
Glucose, Bld: 89 mg/dL (ref 70–99)
Potassium: 4.5 mmol/L (ref 3.5–5.1)
Sodium: 133 mmol/L — ABNORMAL LOW (ref 135–145)
Total Bilirubin: 0.8 mg/dL (ref 0.3–1.2)
Total Protein: 7.5 g/dL (ref 6.5–8.1)

## 2019-01-07 LAB — CHLAMYDIA/NGC RT PCR (ARMC ONLY)
Chlamydia Tr: NOT DETECTED
N gonorrhoeae: NOT DETECTED

## 2019-01-07 LAB — TYPE AND SCREEN
ABO/RH(D): O POS
Antibody Screen: NEGATIVE

## 2019-01-07 LAB — CBC
HCT: 39.7 % (ref 36.0–46.0)
Hemoglobin: 12.6 g/dL (ref 12.0–15.0)
MCH: 27.5 pg (ref 26.0–34.0)
MCHC: 31.7 g/dL (ref 30.0–36.0)
MCV: 86.5 fL (ref 80.0–100.0)
Platelets: 417 10*3/uL — ABNORMAL HIGH (ref 150–400)
RBC: 4.59 MIL/uL (ref 3.87–5.11)
RDW: 14.9 % (ref 11.5–15.5)
WBC: 21 10*3/uL — ABNORMAL HIGH (ref 4.0–10.5)
nRBC: 0.2 % (ref 0.0–0.2)

## 2019-01-07 LAB — PROTEIN / CREATININE RATIO, URINE
Creatinine, Urine: 198 mg/dL
Protein Creatinine Ratio: 0.33 mg/mg{Cre} — ABNORMAL HIGH (ref 0.00–0.15)
Total Protein, Urine: 66 mg/dL

## 2019-01-07 MED ORDER — LIDOCAINE HCL (PF) 1 % IJ SOLN: 30.0000 mL | INTRAMUSCULAR | Status: DC | PRN

## 2019-01-07 MED ORDER — OXYTOCIN BOLUS FROM INFUSION
500.0000 mL | Freq: Once | INTRAVENOUS | Status: AC
  Administered 2019-01-07: 500 mL via INTRAVENOUS

## 2019-01-07 MED ORDER — LACTATED RINGERS IV SOLN
INTRAVENOUS | Status: DC
  Administered 2019-01-07: 21:00:00 via INTRAVENOUS

## 2019-01-07 MED ORDER — LACTATED RINGERS IV SOLN: 500.0000 mL | INTRAVENOUS | Status: DC | PRN

## 2019-01-07 MED ORDER — ONDANSETRON HCL 4 MG/2ML IJ SOLN: 4.0000 mg | Freq: Four times a day (QID) | INTRAMUSCULAR | Status: DC | PRN

## 2019-01-07 MED ORDER — ACETAMINOPHEN 325 MG PO TABS: 650.0000 mg | ORAL_TABLET | ORAL | Status: DC | PRN

## 2019-01-07 MED ORDER — OXYTOCIN 40 UNITS IN NORMAL SALINE INFUSION - SIMPLE MED
2.5000 [IU]/h | INTRAVENOUS | Status: DC
  Administered 2019-01-07: 2.5 [IU]/h via INTRAVENOUS
  Filled 2019-01-07: qty 1000

## 2019-01-07 MED ORDER — IBUPROFEN 600 MG PO TABS
600.0000 mg | ORAL_TABLET | Freq: Four times a day (QID) | ORAL | Status: DC
  Administered 2019-01-07 – 2019-01-09 (×7): 600 mg via ORAL
  Filled 2019-01-07 (×7): qty 1

## 2019-01-07 MED ORDER — LABETALOL HCL 5 MG/ML IV SOLN
20.0000 mg | Freq: Once | INTRAVENOUS | Status: DC | PRN
  Filled 2019-01-07: qty 4

## 2019-01-07 MED ORDER — LABETALOL HCL 5 MG/ML IV SOLN
40.0000 mg | Freq: Once | INTRAVENOUS | Status: DC | PRN
  Filled 2019-01-07: qty 8

## 2019-01-07 MED ORDER — LABETALOL HCL 5 MG/ML IV SOLN
80.0000 mg | Freq: Once | INTRAVENOUS | Status: DC | PRN
  Filled 2019-01-07: qty 16

## 2019-01-07 NOTE — Discharge Summary (Signed)
OB Discharge Summary     Patient Name: Kristen Jacobson DOB: [birth year] MRN: [id number]  Date of admission: 01/07/2019 Delivering Provider: Oswaldo Conroy, CNM  Date of Delivery: 01/07/2019  Date of discharge: 01/09/2019  Admitting diagnosis: PROM, active labor Intrauterine pregnancy: [redacted]w[redacted]d     Secondary diagnosis: None     Discharge diagnosis: Term Pregnancy Delivered                         Hospital course:  Onset of Labor With Vaginal Delivery     33 y.o. yo G2P1 at [redacted]w[redacted]d was admitted in Active Labor on 01/07/2019. Patient had an uncomplicated labor course as follows:  Membrane Rupture Time/Date: 8:00 PM ,01/07/2019   Intrapartum Procedures: Episiotomy: None [1]                                         Lacerations:  None [1]  Patient had a delivery of a Viable infant. 01/07/2019  Information for the patient's newborn:  Nevia, Mallozzi [[id number]]  Delivery Method: Vag-Spont    Patient had an uncomplicated postpartum course.  She is ambulating and voiding without difficulty. She is tolerating regular diet and her pain is controlled with PO medication. Patient is discharged home in stable condition on 01/09/19.                                                                  Post partum procedures:none  Complications: None  Physical exam on 01/09/2019: Vitals:   01/08/19 1727 01/08/19 2020 01/08/19 2345 01/09/19 0327  BP: 133/81 (!) 143/93 (!) 145/86 125/70  Pulse: 91 79 78 77  Resp: 18 20 20 20   Temp: 98.2 F (36.8 C) 98.7 F (37.1 C) 98.2 F (36.8 C) 97.6 F (36.4 C)  TempSrc: Oral Oral  Oral  SpO2: 98%  97% 99%  Weight:      Height:       General: alert, cooperative and no distress Lochia: appropriate Uterine Fundus: firm Incision: N/A DVT Evaluation: No evidence of DVT seen on physical exam.  Labs: Lab Results  Component Value Date   WBC 25.1 (H) 01/08/2019   HGB 12.0 01/08/2019   HCT 38.1 01/08/2019   MCV 88.6 01/08/2019   PLT 355  01/08/2019   CMP Latest Ref Rng & Units 01/07/2019  Glucose 70 - 99 mg/dL 89  BUN 6 - 20 mg/dL 12  Creatinine 1.95 - 0.93 mg/dL 2.67  Sodium 124 - 580 mmol/L 133(L)  Potassium 3.5 - 5.1 mmol/L 4.5  Chloride 98 - 111 mmol/L 99  CO2 22 - 32 mmol/L 19(L)  Calcium 8.9 - 10.3 mg/dL 9.7  Total Protein 6.5 - 8.1 g/dL 7.5  Total Bilirubin 0.3 - 1.2 mg/dL 0.8  Alkaline Phos 38 - 126 U/L 284(H)  AST 15 - 41 U/L 33  ALT 0 - 44 U/L 17    Discharge instruction: per After Visit Summary.  Medications:  Allergies as of 01/09/2019      Reactions   Coconut Flavor Nausea And Vomiting      Medication List    STOP taking these medications   naproxen  500 MG tablet Commonly known as:  Naprosyn       Diet: routine diet  Activity: Advance as tolerated. Pelvic rest for 6 weeks.   Outpatient follow up: Follow-up Information    Oswaldo ConroySchmid, Jacelyn Y, CNM. Schedule an appointment as soon as possible for a visit in 6 week(s).   Specialties:  Certified Nurse Midwife, Radiology Why:  Postpartum visit and Nexplanon insertion Contact information: [address] [place] [address] [phone]             Postpartum contraception: Nexplanon Rhogam Given postpartum: NA Rubella vaccine given postpartum: Rubella Immune Varicella vaccine given postpartum: Varicella Immune TDaP given antepartum or postpartum: Yes  Newborn Data: Live born female  Birth Weight: 6 lb 5.9 oz (2890 g) APGAR: 6, 9  Newborn Delivery   Birth date/time:  01/07/2019 22:01:00 Delivery type:  Vaginal, Spontaneous      Baby Feeding: Formula feeding  Disposition:home with mother  SIGNED:  Tresea MallJane Jaselle Pryer, CNM 01/09/2019 10:22 AM

## 2019-01-07 NOTE — Anesthesia Preprocedure Evaluation (Deleted)
Anesthesia Evaluation    Airway       Dental   Pulmonary former smoker,          Cardiovascular hypertension,     Neuro/Psych    GI/Hepatic   Endo/Other    Renal/GU      Musculoskeletal   Abdominal   Peds  Hematology   Anesthesia Other Findings   Reproductive/Obstetrics                          Anesthesia Physical Anesthesia Plan Anesthesia Quick Evaluation  

## 2019-01-07 NOTE — H&P (Signed)
Obstetrics Admission History & Physical   Rupture of membranes, contractions   HPI:  33 y.o. G2P1 @ [redacted]w[redacted]d (01/23/2019, by Last Menstrual Period). Admitted on 01/07/2019:   Patient Active Problem List   Diagnosis Date Noted  . PROM (premature rupture of membranes) 01/07/2019     Presents for rupture of membranes around 1930, reports clear fluid and painful contractions with some bloody show. She has not had prenatal care for this pregnancy. She had one ultrasound on 09/18/2018 which showed a singleton gestation at 21w 6d. She reports one previous vaginal delivery 11 years ago with no complications. She has been using amphetamines during this pregnancy and last use was this morning around 1000.   Prenatal care at: No prenatal care. Pregnancy complicated by drug use.  ROS: A review of systems was performed and negative, except as stated in the above HPI. PMHx:  Past Medical History:  Diagnosis Date  . Hypertension    PSHx: History reviewed. No pertinent surgical history. Medications:  Medications Prior to Admission  Medication Sig Dispense Refill Last Dose  . naproxen (NAPROSYN) 500 MG tablet Take 1 tablet (500 mg total) by mouth 2 (two) times daily with a meal. 30 tablet 0    Allergies: is allergic to coconut flavor. OBHx:  OB History  Gravida Para Term Preterm AB Living  2 1          SAB TAB Ectopic Multiple Live Births               # Outcome Date GA Lbr Len/2nd Weight Sex Delivery Anes PTL Lv  2 Current           1 Para            FHx: Negative/unremarkable except as detailed in HPI.Marland Kitchen  No family history of birth defects. Soc Hx: Former smoker, Alcohol: none and Recreational drug use: current: methamphetamine  Objective:   Vitals:   01/07/19 2132  Resp: (!) 22  Temp: (!) 97.2 F (36.2 C)   Constitutional: Well nourished, well developed female in no acute distress.  HEENT: normal Skin: Warm and dry.  Cardiovascular: Regular rate  Extremity: trace to 1+ bilateral  pedal edema Respiratory: Normal respiratory effort Abdomen: gravid, painful contractions Neuro: Cranial nerves grossly intact Psych: Alert and Oriented x3. No memory deficits. Normal mood and affect.  MS: normal gait, normal bilateral lower extremity ROM/strength/stability.  Cervix: 7.5-8/80/0 (RN exam) Uterus: Spontaneous uterine activity   EFM:FHR: 120 bpm, variability: moderate,  accelerations:  Present,  decelerations:  Present Early, Variable Toco: Frequency: Every 2-3 minutes, Duration: 50-80 seconds and Intensity: moderate   Perinatal info:  All labs pending.  Assessment & Plan:   33 y.o. G2P1 @ [redacted]w[redacted]d, Admitted on 01/07/2019 with PROM and in active labor.     Admit for labor, Observe for cervical change, Fetal Wellbeing Reassuring and GBS status unknown, treat as needed.  Marcelyn Bruins, CNM [facility], [facility] 01/07/2019

## 2019-01-08 LAB — CBC
HCT: 38.1 % (ref 36.0–46.0)
Hemoglobin: 12 g/dL (ref 12.0–15.0)
MCH: 27.9 pg (ref 26.0–34.0)
MCHC: 31.5 g/dL (ref 30.0–36.0)
MCV: 88.6 fL (ref 80.0–100.0)
Platelets: 355 10*3/uL (ref 150–400)
RBC: 4.3 MIL/uL (ref 3.87–5.11)
RDW: 15 % (ref 11.5–15.5)
WBC: 25.1 10*3/uL — ABNORMAL HIGH (ref 4.0–10.5)
nRBC: 0 % (ref 0.0–0.2)

## 2019-01-08 MED ORDER — WITCH HAZEL-GLYCERIN EX PADS: 1.0000 "application " | MEDICATED_PAD | CUTANEOUS | Status: DC | PRN

## 2019-01-08 MED ORDER — TETANUS-DIPHTH-ACELL PERTUSSIS 5-2.5-18.5 LF-MCG/0.5 IM SUSP
0.5000 mL | INTRAMUSCULAR | Status: DC | PRN
  Filled 2019-01-08 (×2): qty 0.5

## 2019-01-08 MED ORDER — DIBUCAINE (PERIANAL) 1 % EX OINT: 1.0000 "application " | TOPICAL_OINTMENT | CUTANEOUS | Status: DC | PRN

## 2019-01-08 MED ORDER — SENNOSIDES-DOCUSATE SODIUM 8.6-50 MG PO TABS
2.0000 | ORAL_TABLET | ORAL | Status: DC
  Filled 2019-01-08: qty 2

## 2019-01-08 MED ORDER — ONDANSETRON HCL 4 MG/2ML IJ SOLN: 4.0000 mg | INTRAMUSCULAR | Status: DC | PRN

## 2019-01-08 MED ORDER — ONDANSETRON HCL 4 MG PO TABS: 4.0000 mg | ORAL_TABLET | ORAL | Status: DC | PRN

## 2019-01-08 MED ORDER — SIMETHICONE 80 MG PO CHEW
80.0000 mg | CHEWABLE_TABLET | ORAL | Status: DC | PRN
  Administered 2019-01-08 (×2): 80 mg via ORAL
  Filled 2019-01-08 (×2): qty 1

## 2019-01-08 MED ORDER — ACETAMINOPHEN 325 MG PO TABS
650.0000 mg | ORAL_TABLET | ORAL | Status: DC | PRN
  Administered 2019-01-08: 03:00:00 650 mg via ORAL
  Filled 2019-01-08: qty 2

## 2019-01-08 MED ORDER — COCONUT OIL OIL: 1.0000 "application " | TOPICAL_OIL | Status: DC | PRN

## 2019-01-08 MED ORDER — BENZOCAINE-MENTHOL 20-0.5 % EX AERO: 1.0000 "application " | INHALATION_SPRAY | CUTANEOUS | Status: DC | PRN

## 2019-01-08 MED ORDER — DIPHENHYDRAMINE HCL 25 MG PO CAPS: 25.0000 mg | ORAL_CAPSULE | Freq: Four times a day (QID) | ORAL | Status: DC | PRN

## 2019-01-08 MED ORDER — PRENATAL MULTIVITAMIN CH
1.0000 | ORAL_TABLET | Freq: Every day | ORAL | Status: DC
  Administered 2019-01-08 – 2019-01-09 (×2): 1 via ORAL
  Filled 2019-01-08 (×2): qty 1

## 2019-01-08 NOTE — Plan of Care (Signed)
Vs stable; up ad lib; tolerating regular diet; taking motrin for pain control; educated on eat, sleep console for baby

## 2019-01-08 NOTE — Progress Notes (Signed)
Obstetric Postpartum Daily Progress Note Subjective:  33 y.o. G2P1001 postpartum day #1 status post vaginal delivery.  She is ambulating, is tolerating po, is voiding spontaneously.  Her pain is well controlled on PO pain medications. Her lochia is less than menses.  She denies HA, visual changes, and RUQ pain.   Medications SCHEDULED MEDICATIONS  . ibuprofen  600 mg Oral Q6H  . prenatal multivitamin  1 tablet Oral Q1200  . senna-docusate  2 tablet Oral Q24H    MEDICATION INFUSIONS    PRN MEDICATIONS  acetaminophen, benzocaine-Menthol, coconut oil, witch hazel-glycerin **AND** dibucaine, diphenhydrAMINE, ondansetron **OR** ondansetron (ZOFRAN) IV, simethicone, Tdap    Objective:   Vitals:   01/08/19 0231 01/08/19 0242 01/08/19 0417 01/08/19 0824  BP: (!) 162/95 (!) 152/93 (!) 150/99 (!) 147/97  Pulse: 75 75 79 81  Resp: 18  20 18   Temp: 97.8 F (36.6 C)  98.6 F (37 C) 98 F (36.7 C)  TempSrc: Oral  Oral Oral  SpO2: 100%   99%  Weight:      Height:        Current Vital Signs 24h Vital Sign Ranges  T 98 F (36.7 C) Temp  Avg: 98 F (36.7 C)  Min: 97.2 F (36.2 C)  Max: 98.7 F (37.1 C)  BP (!) 147/97(RN.K.Thomas notified of BP .) BP  Min: 117/79  Max: 174/128  HR 81 Pulse  Avg: 91.7  Min: 75  Max: 113  RR 18 Resp  Avg: 19  Min: 18  Max: 22  SaO2 99 % Room Air SpO2  Avg: 99.7 %  Min: 99 %  Max: 100 %       24 Hour I/O Current Shift I/O  Time Ins Outs 04/27 0701 - 04/28 0700 In: 290.6 [I.V.:290.6] Out: 1010 [Urine:800] 04/28 0701 - 04/28 1900 In: 240 [P.O.:240] Out: -   General: NAD Pulmonary: no increased work of breathing Abdomen: non-distended, non-tender, fundus firm at level of umbilicus Extremities: no edema, no erythema, no tenderness  Labs:  Recent Labs  Lab 01/07/19 2111 01/08/19 0622  WBC 21.0* 25.1*  HGB 12.6 12.0  HCT 39.7 38.1  PLT 417* 355     Assessment:   33 y.o. G2P1001 postpartum day # 1 status post SVD, apparent preeclampsia without  severe features  Plan:   1) Acute blood loss anemia - hemodynamically stable and asymptomatic - po ferrous sulfate  2) O POS / Rubella pending  / Varicella pending  3) TDAP status did not receive  4) formula feeding /Contraception = undecided. Reviewed multiple forms of contraception. She will decide.     5) Preeclampsia without severe features: it is possible this is related to her amphetamine use.  Will continue to monitor.  One severe-range BP overnight.  Now elevated, but not severe range. Will consider starting an agent for BP, if her BPs remain high.   6) no prenatal care and drug use: social work consult today  7) Disposition: home tomorrow pending clinical course.  Thomasene Mohair, MD 01/08/2019 11:03 AM

## 2019-01-08 NOTE — Clinical Social Work Maternal (Signed)
  CLINICAL SOCIAL WORK MATERNAL/CHILD NOTE  Patient Details  Name: Kristen Jacobson MRN: [id number] Date of Birth: 1985-09-19  Date:  01/08/2019  Clinical Social Worker Initiating Note:  York Spaniel MSW,LcSW Date/Time: Initiated:  01/08/19/      Child's Name:      Biological Parents:  Mother   Need for Interpreter:  None   Reason for Referral:  Current Substance Use/Substance Use During Pregnancy    Address:  [address] [place] [address]    Phone number:  [phone] (home)     Additional phone number: none  Household Members/Support Persons (HM/SP):       HM/SP Name Relationship DOB or Age  HM/SP -1        HM/SP -2        HM/SP -3        HM/SP -4        HM/SP -5        HM/SP -6        HM/SP -7        HM/SP -8          Natural Supports (not living in the home):  Friends   Professional Supports: None   Employment:     Type of Work:     Education:      Homebound arranged:    Surveyor, quantity Resources:  Self-Pay    Other Resources:  [facility]   Cultural/Religious Considerations Which May Impact Care:  none  Strengths:      Psychotropic Medications:         Pediatrician:       Pediatrician List:   Radiographer, therapeutic    [place]    [place] [facility]      Pediatrician Fax Number:    Risk Factors/Current Problems:  Substance Use    Cognitive State:  Alert , Able to Concentrate    Mood/Affect:  Calm (reacted somewhat dissinterested )   CSW Assessment: CSW spoke with patient today regarding consult for drug use during pregnancy. Patient states she did not know she was pregnant until 6 months into the pregnancy.This is her second child. Her first child lives its father and patient states this was a decision not made by the court system.   She stated that she will be living with her sister and her family with her newborn. Patient states that she has her sister for transportation.  She doesn't have a car seat however, CSW has informed her that she will need to look into getting her own car seat.   Patient denied any history of mental illness. She states she used amphetamines approximately twice a week until she found out she was pregnant and that she didn't use as much. CSW explained that a DSS CPS report will need to be made if her baby's urine drug screen or cord tissue test returns positive. Patient verbalized understanding.   CSW Plan/Description:  CSW Will Continue to Monitor Umbilical Cord Tissue Drug Screen Results and Make Report if [facility], [place] 01/08/2019, 12:14 PM

## 2019-01-09 LAB — HEPATITIS C ANTIBODY: HCV Ab: <0.1 s/co ratio (ref 0.0–0.9)

## 2019-01-09 LAB — RPR, QUANT+TP ABS (REFLEX)
Rapid Plasma Reagin, Quant: 1:1 {titer} — ABNORMAL HIGH
T Pallidum Abs: NONREACTIVE

## 2019-01-09 LAB — HEPATITIS B SURFACE ANTIGEN: Hepatitis B Surface Ag: NEGATIVE

## 2019-01-09 LAB — RPR: RPR Ser Ql: REACTIVE — AB

## 2019-01-09 LAB — VARICELLA ZOSTER ANTIBODY, IGG: Varicella IgG: 1538 index (ref >165)

## 2019-01-09 LAB — RUBELLA SCREEN: Rubella: 1.57 index (ref >0.99)

## 2019-01-09 NOTE — Progress Notes (Signed)
Patient discharged home with infant. Friend of mother/sister present. Discharge instructions and prescriptions given and reviewed with patient. Patient verbalized understanding. Escorted out by staff.

## 2019-01-09 NOTE — Discharge Instructions (Signed)
Discharge Instructions:   Follow-up Appointment: schedule ASAP for a visit in 6 weeks  If there are any new medications, they have been ordered and will be available for pickup at the listed pharmacy on your way home from the hospital.   Call office if you have any of the following: headache, visual changes, fever >101.0 F, chills, shortness of breath, breast concerns, excessive vaginal bleeding, incision drainage or problems, leg pain or redness, depression or any other concerns. If you have vaginal discharge with an odor, let your doctor know.   It is normal to bleed for up to 6 weeks. You should not soak through more than 1 pad in 1 hour. If you have a blood clot larger than your fist with continued bleeding, call your doctor.   Activity: Do not lift > 10 lbs for 6 weeks (do not lift anything heavier than your baby). No intercourse, tampons, swimming pools, hot tubs, baths (only showers) for 6 weeks.  No driving for 1-2 weeks. Continue prenatal vitamin.   Your milk will come in, in the next couple of days (right now it is colostrum). You may have a slight fever when your milk comes in, but it should go away on its own.  If it does not, and rises above 101 F please call the doctor. You will also feel achy and your breasts will be firm. They will also start to leak.   If you have too much milk, your breasts can become engorged, which could lead to mastitis. This is an infection of the milk ducts. It can be very painful and you will need to notify your doctor to obtain a prescription for antibiotics. You can also treat it with a shower or hot/cold compress.   For concerns about your baby, please call your pediatrician.  For breastfeeding concerns, the lactation consultant can be reached at [phone].   Postpartum blues (feelings of happy one minute and sad another minute) are normal for the first few weeks but if it gets worse let your doctor know.   Congratulations! We enjoyed caring for  you and your new bundle of joy!

## 2019-01-10 LAB — CULTURE, BETA STREP (GROUP B ONLY)
